# Patient Record
Sex: Male | Born: 1995 | Race: White | Hispanic: No | Marital: Married | Smoking: Current some day smoker
Health system: Southern US, Community
[De-identification: ages and names within clinical notes are randomized; demographics above are authoritative.]

## PROBLEM LIST (undated history)

## (undated) DIAGNOSIS — I10 Essential (primary) hypertension: Secondary | ICD-10-CM

## (undated) HISTORY — PX: TONSILLECTOMY: SUR1361

---
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
## 2015-03-20 DIAGNOSIS — S70319A Abrasion, unspecified thigh, initial encounter: Secondary | ICD-10-CM | POA: Insufficient documentation

## 2015-03-20 DIAGNOSIS — H902 Conductive hearing loss, unspecified: Secondary | ICD-10-CM | POA: Insufficient documentation

## 2015-09-16 ENCOUNTER — Encounter (HOSPITAL_COMMUNITY): Payer: Self-pay | Admitting: *Deleted

## 2015-09-16 ENCOUNTER — Emergency Department (HOSPITAL_COMMUNITY)
Admission: EM | Admit: 2015-09-16 | Discharge: 2015-09-16 | Disposition: A | Discharge status: Home / Self Care | Payer: BLUE CROSS/BLUE SHIELD | Attending: Emergency Medicine | Admitting: Emergency Medicine

## 2015-09-16 DIAGNOSIS — Z79899 Other long term (current) drug therapy: Secondary | ICD-10-CM | POA: Insufficient documentation

## 2015-09-16 DIAGNOSIS — R42 Dizziness and giddiness: Secondary | ICD-10-CM | POA: Diagnosis not present

## 2015-09-16 DIAGNOSIS — I10 Essential (primary) hypertension: Secondary | ICD-10-CM | POA: Diagnosis not present

## 2015-09-16 HISTORY — DX: Essential (primary) hypertension: I10

## 2015-09-16 MED ORDER — MECLIZINE HCL 50 MG PO TABS: 25.0000 mg | ORAL_TABLET | Freq: Three times a day (TID) | ORAL | Status: DC | PRN

## 2015-09-16 MED ORDER — MECLIZINE HCL 25 MG PO TABS
25.0000 mg | ORAL_TABLET | Freq: Once | ORAL | Status: AC
  Administered 2015-09-16: 25 mg via ORAL
  Filled 2015-09-16: qty 1

## 2015-09-16 NOTE — Discharge Instructions (Signed)
You have been seen today for high blood pressure. You will need to follow up with your PCP as soon as possible for possible medication adjustment. Return to ED should symptoms worsen.

## 2015-09-16 NOTE — ED Notes (Addendum)
Pt reports that he started feeling bad approx 1 hour ago. Denies headache but has dizziness and "feels drunk" hx of htn and takes lisinopril. Reports HTN pta. Also recently started on antibiotics due to toe infection.
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
## 2017-09-02 ENCOUNTER — Ambulatory Visit (INDEPENDENT_AMBULATORY_CARE_PROVIDER_SITE_OTHER): Payer: BLUE CROSS/BLUE SHIELD | Admitting: Podiatry

## 2017-09-02 ENCOUNTER — Ambulatory Visit: Payer: Self-pay

## 2017-09-02 ENCOUNTER — Encounter: Payer: Self-pay | Admitting: Podiatry

## 2017-09-02 DIAGNOSIS — L600 Ingrowing nail: Secondary | ICD-10-CM | POA: Diagnosis not present

## 2017-09-02 DIAGNOSIS — M79671 Pain in right foot: Secondary | ICD-10-CM

## 2017-09-02 MED ORDER — NEOMYCIN-POLYMYXIN-HC 3.5-10000-1 OT SOLN: OTIC | 0 refills | Status: DC

## 2017-09-02 NOTE — Patient Instructions (Signed)

## 2017-09-02 NOTE — Progress Notes (Signed)
  Subjective:  Patient ID: Randy SextonEli Zimmerman, male    DOB: 04/04/1996,  MRN: [id number] HPI Chief Complaint  Patient presents with  . Ingrown Toenail    Patient presents today for ingrown bilat borders of rt great toe x 2 months off and on.  He states toe is red, draining and very sore to touch.  He has been clipping nail out himself, but just grows back and using neosporin and peroxide to treat toe    21 y.o. male presents with the above complaint.     Past Medical History:  Diagnosis Date  . Hypertension    Past Surgical History:  Procedure Laterality Date  . TONSILLECTOMY     No current outpatient medications on file.  Allergies  Allergen Reactions  . Eggs Or Egg-Derived Products Nausea Only  . Prednisone     Extreme anger   Review of Systems  All other systems reviewed and are negative.  Objective:  There were no vitals filed for this visit.  General: Well developed, nourished, in no acute distress, alert and oriented x3   Dermatological: Skin is warm, dry and supple bilateral. Nails x 10 are well maintained; remaining integument appears unremarkable at this time. There are no open sores, no preulcerative lesions, no rash or signs of infection present. Sharp incurvated margins along the tibia and fibular border border hallux right. Gross granulation tissue and pain with mild erythema.  Vascular: Dorsalis Pedis artery and Posterior Tibial artery pedal pulses are 2/4 bilateral with immedate capillary fill time. Pedal hair growth present. No varicosities and no lower extremity edema present bilateral.   Neruologic: Grossly intact via light touch bilateral. Vibratory intact via tuning fork bilateral. Protective threshold with Semmes Wienstein monofilament intact to all pedal sites bilateral. Patellar and Achilles deep tendon reflexes 2+ bilateral. No Babinski or clonus noted bilateral.   Musculoskeletal: No gross boney pedal deformities bilateral. No pain, crepitus, or limitation  noted with foot and ankle range of motion bilateral. Muscular strength 5/5 in all groups tested bilateral.  Gait: Unassisted, Nonantalgic.    Radiographs:  None taken  Assessment & Plan:   Assessment: Ingrown toenail tibial border hallux right  Plan: Discussed etiology pathology inserted versus surgical therapies. At this point decided to perform a chemical sect made which was performed today after local anesthesia was diminished. He tolerated procedure well without complications. He was provided with prescription for Cortisporin Otic be applied twice daily after soaking. He was instructed that if he broke his skin out he would opt medication. He will also soak twice daily Betadine and water. He will then apply Band-Aid twice daily. He will follow up with me in 2 weeks     Jorah Hua T. RockHyatt, North Dakota[place]PM

## 2017-09-14 ENCOUNTER — Ambulatory Visit: Payer: BLUE CROSS/BLUE SHIELD | Admitting: Podiatry

## 2017-12-03 ENCOUNTER — Emergency Department: Payer: Worker's Compensation

## 2017-12-03 ENCOUNTER — Other Ambulatory Visit: Payer: Self-pay

## 2017-12-03 ENCOUNTER — Encounter: Payer: Self-pay | Admitting: Emergency Medicine

## 2017-12-03 ENCOUNTER — Emergency Department
Admission: EM | Admit: 2017-12-03 | Discharge: 2017-12-03 | Disposition: A | Discharge status: Home / Self Care | Payer: Worker's Compensation | Attending: Emergency Medicine | Admitting: Emergency Medicine

## 2017-12-03 DIAGNOSIS — S60419A Abrasion of unspecified finger, initial encounter: Secondary | ICD-10-CM

## 2017-12-03 DIAGNOSIS — Y990 Civilian activity done for income or pay: Secondary | ICD-10-CM | POA: Diagnosis not present

## 2017-12-03 DIAGNOSIS — Z23 Encounter for immunization: Secondary | ICD-10-CM | POA: Diagnosis not present

## 2017-12-03 DIAGNOSIS — S60511A Abrasion of right hand, initial encounter: Secondary | ICD-10-CM | POA: Diagnosis not present

## 2017-12-03 DIAGNOSIS — I10 Essential (primary) hypertension: Secondary | ICD-10-CM | POA: Insufficient documentation

## 2017-12-03 DIAGNOSIS — W25XXXA Contact with sharp glass, initial encounter: Secondary | ICD-10-CM | POA: Insufficient documentation

## 2017-12-03 DIAGNOSIS — Y929 Unspecified place or not applicable: Secondary | ICD-10-CM | POA: Diagnosis not present

## 2017-12-03 DIAGNOSIS — Y939 Activity, unspecified: Secondary | ICD-10-CM | POA: Insufficient documentation

## 2017-12-03 MED ORDER — TETANUS-DIPHTH-ACELL PERTUSSIS 5-2.5-18.5 LF-MCG/0.5 IM SUSP
0.5000 mL | Freq: Once | INTRAMUSCULAR | Status: AC
  Administered 2017-12-03: 0.5 mL via INTRAMUSCULAR
  Filled 2017-12-03: qty 0.5

## 2017-12-03 MED ORDER — SULFAMETHOXAZOLE-TRIMETHOPRIM 800-160 MG PO TABS: 1.0000 | ORAL_TABLET | Freq: Two times a day (BID) | ORAL | 0 refills | Status: DC

## 2017-12-03 MED ORDER — NEOMYCIN-POLYMYXIN-PRAMOXINE 1 % EX CREA: TOPICAL_CREAM | Freq: Two times a day (BID) | CUTANEOUS | 0 refills | Status: DC

## 2017-12-03 NOTE — ED Notes (Signed)
Abrasions and lacerations cleaned with sterile water and betadine at this time.

## 2017-12-03 NOTE — Discharge Instructions (Signed)
Keep wound clean and dry.

## 2017-12-03 NOTE — ED Notes (Signed)
Per [employer] chief Nim Harris at [phone] pt is workers comp with no urine drug screen or labs needed
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
## 2018-04-13 ENCOUNTER — Ambulatory Visit
Admission: RE | Admit: 2018-04-13 | Discharge: 2018-04-13 | Disposition: A | Discharge status: Home / Self Care | Payer: No Typology Code available for payment source | Source: Ambulatory Visit | Attending: Nurse Practitioner | Admitting: Nurse Practitioner

## 2018-04-13 ENCOUNTER — Other Ambulatory Visit: Payer: Self-pay | Admitting: Nurse Practitioner

## 2018-04-13 DIAGNOSIS — Z021 Encounter for pre-employment examination: Secondary | ICD-10-CM
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
## 2019-05-13 ENCOUNTER — Ambulatory Visit: Payer: Self-pay | Admitting: Internal Medicine

## 2019-06-11 IMAGING — CR DG CHEST 1V
1 series · 1 of 1 positions shown · non-contrast
Comparison: None.

CLINICAL DATA: Pre-employment chest x-ray.  No symptoms.

EXAM:
CHEST  1 VIEW

[w chest pa]
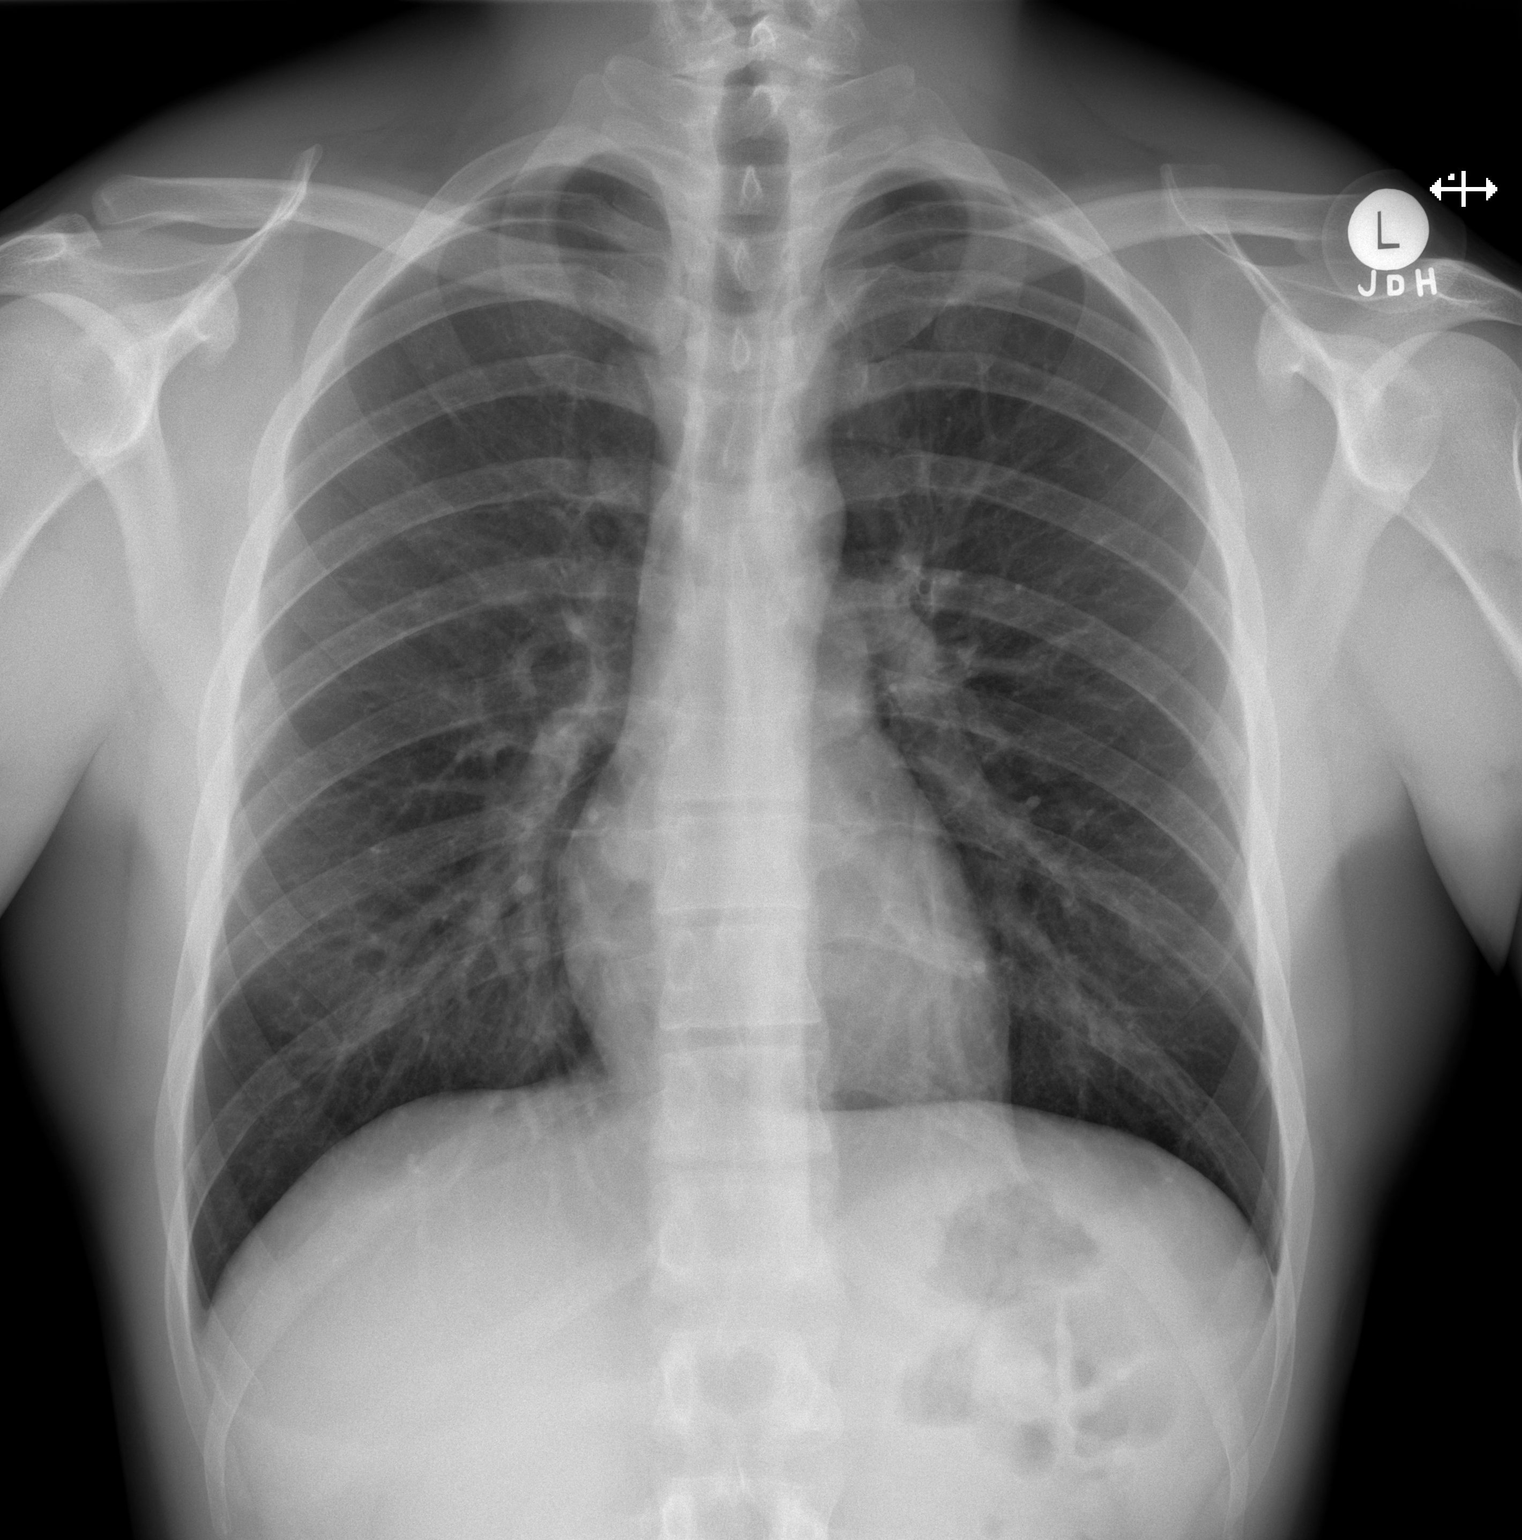

[1 of 1 positions shown; findings below may reference images not displayed]

FINDINGS: The heart size and mediastinal contours are within normal limits.
Both lungs are clear. The visualized skeletal structures are
unremarkable.
IMPRESSION: No active disease.

## 2019-09-12 ENCOUNTER — Other Ambulatory Visit: Payer: Self-pay

## 2019-09-12 DIAGNOSIS — Z20822 Contact with and (suspected) exposure to covid-19: Secondary | ICD-10-CM

## 2019-09-14 LAB — NOVEL CORONAVIRUS, NAA: SARS-CoV-2, NAA: NOT DETECTED

## 2019-09-15 ENCOUNTER — Telehealth: Payer: Self-pay

## 2019-09-15 NOTE — Telephone Encounter (Signed)
Caller given negative result and verbalized understanding  

## 2019-10-04 ENCOUNTER — Ambulatory Visit: Payer: 59 | Attending: Internal Medicine

## 2019-10-04 DIAGNOSIS — Z20822 Contact with and (suspected) exposure to covid-19: Secondary | ICD-10-CM

## 2019-10-05 LAB — NOVEL CORONAVIRUS, NAA: SARS-CoV-2, NAA: NOT DETECTED

## 2020-03-01 DIAGNOSIS — S9032XA Contusion of left foot, initial encounter: Secondary | ICD-10-CM | POA: Insufficient documentation

## 2020-03-03 DIAGNOSIS — Y999 Unspecified external cause status: Secondary | ICD-10-CM | POA: Insufficient documentation

## 2020-03-03 DIAGNOSIS — W208XXA Other cause of strike by thrown, projected or falling object, initial encounter: Secondary | ICD-10-CM | POA: Insufficient documentation

## 2021-09-17 ENCOUNTER — Ambulatory Visit: Payer: Self-pay

## 2021-09-17 ENCOUNTER — Ambulatory Visit: Payer: Self-pay | Admitting: Physician Assistant

## 2021-09-17 ENCOUNTER — Encounter: Payer: Self-pay | Admitting: Physician Assistant

## 2021-09-17 ENCOUNTER — Other Ambulatory Visit: Payer: Self-pay

## 2021-09-17 VITALS — BP 147/90 | HR 73 | Temp 98.9°F | Resp 14 | Ht 71.0 in | Wt 200.0 lb

## 2021-09-17 DIAGNOSIS — Z021 Encounter for pre-employment examination: Secondary | ICD-10-CM

## 2021-09-17 LAB — POCT URINALYSIS DIPSTICK
Bilirubin, UA: NEGATIVE
Blood, UA: NEGATIVE
Glucose, UA: NEGATIVE
Ketones, UA: NEGATIVE
Leukocytes, UA: NEGATIVE
Nitrite, UA: NEGATIVE
Protein, UA: NEGATIVE
Spec Grav, UA: 1.015 (ref 1.010–1.025)
Urobilinogen, UA: 0.2 E.U./dL
pH, UA: 6 (ref 5.0–8.0)

## 2021-09-17 NOTE — Progress Notes (Signed)
Pt denies any issues or concerns at this time./CL,RMA  Reviewed CDC recommendations for importance of HIV/ Hep C screening once in lifetime. Patient has declined HIV / Hep C screenings today and will let us know if they should change their mind in the future.   Reviewed CDC recommendations for importance of the COVID-19 Vaccine. Pt has declined the vaccine today.

## 2021-09-17 NOTE — Progress Notes (Signed)
Pt denies any issues or concerns at this time/CL,RMA ?

## 2021-09-17 NOTE — Progress Notes (Signed)
° °  Subjective: Preemployment exam    Patient ID: Randy Zimmerman, male    DOB: [birth year], 25 y.o.   MRN: [id number]  HPI Patient presents for preemployment physical exam for [employer].  Patient reports no concerns or complaints.   Review of Systems Hypertension and insomnia.    Objective:   Physical Exam  No acute distress.  Temperature 98.9, pulse 73, respiration 14, BP is 147/90, patient 90% O2 sat on room air.  Patient weighs 200 pounds and BMI is 27.89. HEENT is unremarkable.  Neck is supple without lymphadenopathy or bruits.  Lungs are clear to auscultation.  Heart regular rate and rhythm. No obvious deformity to the upper or lower extremities.  Patient has full and equal range of motion of the upper and lower extremities. No obvious deformity to cervical lumbar spine.  Patient has full and equal range of motion cervical lumbar spine. Cranial nerves II through XII are grossly intact.  DTR 2+ without clonus.      Assessment & Plan: Well exam.   Patient blood pressure is elevated.  Patient relates that he has been out of blood pressure medication for 2 weeks.  Advised patient to purchase and take medication and follow-up in 5 days for recheck of blood pressure.

## 2021-09-20 ENCOUNTER — Ambulatory Visit: Payer: Self-pay

## 2021-09-20 ENCOUNTER — Other Ambulatory Visit: Payer: Self-pay

## 2021-09-20 VITALS — BP 138/77 | HR 69 | Resp 14

## 2021-09-20 DIAGNOSIS — Z0130 Encounter for examination of blood pressure without abnormal findings: Secondary | ICD-10-CM

## 2021-09-20 NOTE — Progress Notes (Signed)
Pt presents today for BP check. Pt will return Monday for another check per Ron Smith,PA-C. After two days of BP medication pt wanted to stop by to check for himself.

## 2021-09-21 LAB — CMP12+LP+TP+TSH+6AC+CBC/D/PLT
ALT: 23 IU/L (ref 0–44)
AST: 23 IU/L (ref 0–40)
Albumin/Globulin Ratio: 2 (ref 1.2–2.2)
Albumin: 5 g/dL (ref 4.1–5.2)
Alkaline Phosphatase: 61 IU/L (ref 44–121)
BUN/Creatinine Ratio: 10 (ref 9–20)
BUN: 12 mg/dL (ref 6–20)
Basophils Absolute: 0.1 10*3/uL (ref 0.0–0.2)
Basos: 1 %
Bilirubin Total: 0.7 mg/dL (ref 0.0–1.2)
Calcium: 9.9 mg/dL (ref 8.7–10.2)
Chloride: 101 mmol/L (ref 96–106)
Chol/HDL Ratio: 3.6 ratio (ref 0.0–5.0)
Cholesterol, Total: 210 mg/dL — ABNORMAL HIGH (ref 100–199)
Creatinine, Ser: 1.21 mg/dL (ref 0.76–1.27)
EOS (ABSOLUTE): 0 10*3/uL (ref 0.0–0.4)
Eos: 0 %
Estimated CHD Risk: 0.6 times avg. (ref 0.0–1.0)
Free Thyroxine Index: 3.1 (ref 1.2–4.9)
GGT: 13 IU/L (ref 0–65)
Globulin, Total: 2.5 g/dL (ref 1.5–4.5)
Glucose: 95 mg/dL (ref 70–99)
HDL: 58 mg/dL (ref >39)
Hematocrit: 45.7 % (ref 37.5–51.0)
Hemoglobin: 15.8 g/dL (ref 13.0–17.7)
Immature Grans (Abs): 0 10*3/uL (ref 0.0–0.1)
Immature Granulocytes: 0 %
Iron: 143 ug/dL (ref 38–169)
LDH: 219 IU/L (ref 121–224)
LDL Chol Calc (NIH): 140 mg/dL — ABNORMAL HIGH (ref 0–99)
Lymphocytes Absolute: 1.7 10*3/uL (ref 0.7–3.1)
Lymphs: 24 %
MCH: 29.5 pg (ref 26.6–33.0)
MCHC: 34.6 g/dL (ref 31.5–35.7)
MCV: 85 fL (ref 79–97)
Monocytes Absolute: 0.4 10*3/uL (ref 0.1–0.9)
Monocytes: 6 %
Neutrophils Absolute: 5.1 10*3/uL (ref 1.4–7.0)
Neutrophils: 69 %
Phosphorus: 3.4 mg/dL (ref 2.8–4.1)
Platelets: 282 10*3/uL (ref 150–450)
Potassium: 4.3 mmol/L (ref 3.5–5.2)
RBC: 5.36 x10E6/uL (ref 4.14–5.80)
RDW: 12.1 % (ref 11.6–15.4)
Sodium: 139 mmol/L (ref 134–144)
T3 Uptake Ratio: 30 % (ref 24–39)
T4, Total: 10.3 ug/dL (ref 4.5–12.0)
TSH: 1.01 u[IU]/mL (ref 0.450–4.500)
Total Protein: 7.5 g/dL (ref 6.0–8.5)
Triglycerides: 68 mg/dL (ref 0–149)
Uric Acid: 6 mg/dL (ref 3.8–8.4)
VLDL Cholesterol Cal: 12 mg/dL (ref 5–40)
WBC: 7.3 10*3/uL (ref 3.4–10.8)
eGFR: 84 mL/min/{1.73_m2} (ref >59)

## 2021-09-21 LAB — QUANTIFERON-TB GOLD PLUS
QuantiFERON Mitogen Value: 8.22 IU/mL
QuantiFERON Nil Value: 0 IU/mL
QuantiFERON TB1 Ag Value: 0 IU/mL
QuantiFERON TB2 Ag Value: 0 IU/mL
QuantiFERON-TB Gold Plus: NEGATIVE

## 2021-09-21 LAB — HEPATITIS B SURFACE ANTIBODY,QUALITATIVE: Hep B Surface Ab, Qual: REACTIVE

## 2021-09-23 ENCOUNTER — Ambulatory Visit: Payer: Self-pay

## 2021-09-23 ENCOUNTER — Other Ambulatory Visit: Payer: Self-pay

## 2021-09-23 VITALS — BP 143/81

## 2021-09-23 DIAGNOSIS — Z0130 Encounter for examination of blood pressure without abnormal findings: Secondary | ICD-10-CM

## 2021-09-23 NOTE — Progress Notes (Signed)
Returned to [employer] for follow-up BP check as requested by Durward Parcel, PA-C.  States he's been taking his BP medication past 5 days.  His wife has been checking BP at home & systolic has been in 130's & diastolic has been 80 or below.  Reviewed above info with Durward Parcel, PA-C.  AMD

## 2022-01-31 DIAGNOSIS — M542 Cervicalgia: Secondary | ICD-10-CM | POA: Diagnosis not present

## 2022-02-01 DIAGNOSIS — X500XXA Overexertion from strenuous movement or load, initial encounter: Secondary | ICD-10-CM | POA: Diagnosis not present

## 2022-02-01 DIAGNOSIS — S161XXA Strain of muscle, fascia and tendon at neck level, initial encounter: Secondary | ICD-10-CM | POA: Diagnosis not present

## 2022-02-01 DIAGNOSIS — S29012A Strain of muscle and tendon of back wall of thorax, initial encounter: Secondary | ICD-10-CM | POA: Diagnosis not present

## 2022-02-25 ENCOUNTER — Other Ambulatory Visit: Payer: Self-pay

## 2022-02-25 NOTE — Progress Notes (Signed)
Pt cleared pre-employment UDS. 

## 2022-06-03 DIAGNOSIS — R790 Abnormal level of blood mineral: Secondary | ICD-10-CM | POA: Diagnosis not present

## 2022-06-03 DIAGNOSIS — R5383 Other fatigue: Secondary | ICD-10-CM | POA: Diagnosis not present

## 2022-06-03 DIAGNOSIS — Z131 Encounter for screening for diabetes mellitus: Secondary | ICD-10-CM | POA: Diagnosis not present

## 2022-06-03 DIAGNOSIS — E291 Testicular hypofunction: Secondary | ICD-10-CM | POA: Diagnosis not present

## 2022-06-03 DIAGNOSIS — E611 Iron deficiency: Secondary | ICD-10-CM | POA: Diagnosis not present

## 2022-06-03 DIAGNOSIS — E559 Vitamin D deficiency, unspecified: Secondary | ICD-10-CM | POA: Diagnosis not present

## 2022-06-03 DIAGNOSIS — R69 Illness, unspecified: Secondary | ICD-10-CM | POA: Diagnosis not present

## 2022-06-03 DIAGNOSIS — G43109 Migraine with aura, not intractable, without status migrainosus: Secondary | ICD-10-CM | POA: Diagnosis not present

## 2022-06-03 DIAGNOSIS — M549 Dorsalgia, unspecified: Secondary | ICD-10-CM | POA: Diagnosis not present

## 2022-06-03 DIAGNOSIS — I10 Essential (primary) hypertension: Secondary | ICD-10-CM | POA: Diagnosis not present

## 2022-06-03 DIAGNOSIS — E538 Deficiency of other specified B group vitamins: Secondary | ICD-10-CM | POA: Diagnosis not present

## 2022-06-03 DIAGNOSIS — Z1329 Encounter for screening for other suspected endocrine disorder: Secondary | ICD-10-CM | POA: Diagnosis not present

## 2022-06-26 DIAGNOSIS — J019 Acute sinusitis, unspecified: Secondary | ICD-10-CM | POA: Diagnosis not present

## 2022-07-03 DIAGNOSIS — I10 Essential (primary) hypertension: Secondary | ICD-10-CM | POA: Diagnosis not present

## 2022-07-03 DIAGNOSIS — E291 Testicular hypofunction: Secondary | ICD-10-CM | POA: Diagnosis not present

## 2022-09-09 DIAGNOSIS — E291 Testicular hypofunction: Secondary | ICD-10-CM | POA: Diagnosis not present

## 2022-10-11 DIAGNOSIS — J019 Acute sinusitis, unspecified: Secondary | ICD-10-CM | POA: Diagnosis not present

## 2022-10-11 DIAGNOSIS — J069 Acute upper respiratory infection, unspecified: Secondary | ICD-10-CM | POA: Diagnosis not present

## 2022-10-26 DIAGNOSIS — H68109 Unspecified obstruction of Eustachian tube, unspecified ear: Secondary | ICD-10-CM | POA: Diagnosis not present

## 2022-12-02 DIAGNOSIS — Z79899 Other long term (current) drug therapy: Secondary | ICD-10-CM | POA: Diagnosis not present

## 2022-12-02 DIAGNOSIS — E291 Testicular hypofunction: Secondary | ICD-10-CM | POA: Diagnosis not present

## 2022-12-10 ENCOUNTER — Ambulatory Visit: Payer: Self-pay

## 2022-12-10 DIAGNOSIS — Z0000 Encounter for general adult medical examination without abnormal findings: Secondary | ICD-10-CM

## 2022-12-10 LAB — POCT URINALYSIS DIPSTICK
Bilirubin, UA: NEGATIVE
Blood, UA: NEGATIVE
Glucose, UA: NEGATIVE
Ketones, UA: NEGATIVE
Leukocytes, UA: NEGATIVE
Nitrite, UA: NEGATIVE
Protein, UA: NEGATIVE
Spec Grav, UA: 1.02 (ref 1.010–1.025)
Urobilinogen, UA: 0.2 E.U./dL
pH, UA: 6 (ref 5.0–8.0)

## 2022-12-10 NOTE — Progress Notes (Signed)
Pt presents today to complete lab portion of physical. [name]

## 2022-12-11 LAB — CMP12+LP+TP+TSH+6AC+CBC/D/PLT
ALT: 22 IU/L (ref 0–44)
AST: 30 IU/L (ref 0–40)
Albumin/Globulin Ratio: 2 (ref 1.2–2.2)
Albumin: 4.7 g/dL (ref 4.3–5.2)
Alkaline Phosphatase: 66 IU/L (ref 44–121)
BUN/Creatinine Ratio: 14 (ref 9–20)
BUN: 17 mg/dL (ref 6–20)
Basophils Absolute: 0 10*3/uL (ref 0.0–0.2)
Basos: 1 %
Bilirubin Total: 0.7 mg/dL (ref 0.0–1.2)
Calcium: 9.5 mg/dL (ref 8.7–10.2)
Chloride: 103 mmol/L (ref 96–106)
Chol/HDL Ratio: 4.6 ratio (ref 0.0–5.0)
Cholesterol, Total: 178 mg/dL (ref 100–199)
Creatinine, Ser: 1.21 mg/dL (ref 0.76–1.27)
EOS (ABSOLUTE): 0.2 10*3/uL (ref 0.0–0.4)
Eos: 3 %
Estimated CHD Risk: 0.9 times avg. (ref 0.0–1.0)
Free Thyroxine Index: 2.3 (ref 1.2–4.9)
GGT: 13 IU/L (ref 0–65)
Globulin, Total: 2.3 g/dL (ref 1.5–4.5)
Glucose: 94 mg/dL (ref 70–99)
HDL: 39 mg/dL — ABNORMAL LOW (ref >39)
Hematocrit: 46.7 % (ref 37.5–51.0)
Hemoglobin: 16.2 g/dL (ref 13.0–17.7)
Immature Grans (Abs): 0 10*3/uL (ref 0.0–0.1)
Immature Granulocytes: 0 %
Iron: 154 ug/dL (ref 38–169)
LDH: 202 IU/L (ref 121–224)
LDL Chol Calc (NIH): 122 mg/dL — ABNORMAL HIGH (ref 0–99)
Lymphocytes Absolute: 1.8 10*3/uL (ref 0.7–3.1)
Lymphs: 28 %
MCH: 30.2 pg (ref 26.6–33.0)
MCHC: 34.7 g/dL (ref 31.5–35.7)
MCV: 87 fL (ref 79–97)
Monocytes Absolute: 0.7 10*3/uL (ref 0.1–0.9)
Monocytes: 11 %
Neutrophils Absolute: 3.6 10*3/uL (ref 1.4–7.0)
Neutrophils: 57 %
Phosphorus: 3.3 mg/dL (ref 2.8–4.1)
Platelets: 312 10*3/uL (ref 150–450)
Potassium: 4 mmol/L (ref 3.5–5.2)
RBC: 5.37 x10E6/uL (ref 4.14–5.80)
RDW: 13.1 % (ref 11.6–15.4)
Sodium: 141 mmol/L (ref 134–144)
T3 Uptake Ratio: 28 % (ref 24–39)
T4, Total: 8.2 ug/dL (ref 4.5–12.0)
TSH: 1.01 u[IU]/mL (ref 0.450–4.500)
Total Protein: 7 g/dL (ref 6.0–8.5)
Triglycerides: 89 mg/dL (ref 0–149)
Uric Acid: 7 mg/dL (ref 3.8–8.4)
VLDL Cholesterol Cal: 17 mg/dL (ref 5–40)
WBC: 6.3 10*3/uL (ref 3.4–10.8)
eGFR: 84 mL/min/{1.73_m2} (ref >59)

## 2022-12-17 ENCOUNTER — Encounter: Payer: Self-pay | Admitting: Physician Assistant

## 2022-12-17 ENCOUNTER — Ambulatory Visit: Payer: Self-pay | Admitting: Physician Assistant

## 2022-12-17 VITALS — BP 148/92 | HR 75 | Temp 98.2°F | Resp 14 | Wt 205.0 lb

## 2022-12-17 DIAGNOSIS — I10 Essential (primary) hypertension: Secondary | ICD-10-CM

## 2022-12-17 DIAGNOSIS — Z0000 Encounter for general adult medical examination without abnormal findings: Secondary | ICD-10-CM

## 2022-12-17 MED ORDER — AMLODIPINE BESYLATE 10 MG PO TABS: 10.0000 mg | ORAL_TABLET | Freq: Every day | ORAL | 0 refills | Status: DC

## 2022-12-17 NOTE — Progress Notes (Signed)
Here for yearly physical with [employer].  No c/o voiced and sees provider with testosterone injections with.  Stated tried Norvasc and Lisinopril in the past without effectiveness or had side effects.

## 2022-12-17 NOTE — Progress Notes (Signed)
[facility]  ____________________________________________   None    (approximate)  I have reviewed the triage vital signs and the nursing notes.   HISTORY  Chief Complaint Annual Exam   HPI Randy Zimmerman is a 27 y.o. male patient presents for annual physical exam.  Voices concern for elevated blood pressure.  Patient has a history of elevated blood pressure in his late teenage years.  Patient state he was placed on a trial of a beta-blocker and an ACE inhibitor which he could not tolerate.  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 Authorizing Provider  testosterone cypionate (DEPOTESTOTERONE CYPIONATE) 100 MG/ML injection Inject 100 mg into the muscle every 14 (fourteen) days. For IM use only   Yes [provider]  traZODone (DESYREL) 50 MG tablet Take 1 tablet by mouth as needed. Patient not taking: Reported on 12/17/2022 03/18/21   [provider]    Allergies Hydrocodone bit-homatrop mbr, Prednisone, and Egg-derived products  Family History  Problem Relation Age of Onset   Hypertension Father    Cancer Paternal Uncle    Breast cancer Maternal Grandmother    Cancer Maternal Grandfather    Cancer Paternal Grandfather     Social History Social History   Tobacco Use   Smoking status: Some Days    Types: Cigars   Smokeless tobacco: Never   Substance Use Topics   Alcohol use: Yes   Drug use: No    Review of Systems Constitutional: No fever/chills Eyes: No visual changes. ENT: No sore throat. Cardiovascular: Denies chest pain. Respiratory: Denies shortness of breath. Gastrointestinal: No abdominal pain.  No nausea, no vomiting.  No diarrhea.  No constipation. Genitourinary: Negative for dysuria. Musculoskeletal: Negative for back pain. Skin: Negative for rash. Neurological: Negative for headaches, focal weakness or numbness. Endocrine: Hypertension ____________________________________________   PHYSICAL EXAM:  VITAL SIGNS: BP is 148/92, pulse 75, respiration 14, temperature 98.2, patient is 90% O2 sat on room air.  Patient weighs 205 pounds BMI is 28.59. Constitutional: Alert and oriented. Well appearing and in no acute distress. Eyes: Conjunctivae are normal. PERRL. EOMI. Head: Atraumatic. Nose: No congestion/rhinnorhea. Mouth/Throat: Mucous membranes are moist.  Oropharynx non-erythematous. Neck: No stridor.  No cervical spine tenderness to palpation. Hematological/Lymphatic/Immunilogical: No cervical lymphadenopathy. Cardiovascular: Normal rate, regular rhythm. Grossly normal heart sounds.  Good peripheral circulation. Respiratory: Normal respiratory effort.  No retractions. Lungs CTAB. Gastrointestinal: Soft and nontender. No distention. No abdominal bruits. No CVA tenderness. Genitourinary: Deferred Musculoskeletal: No lower extremity tenderness nor edema.  No joint effusions. Neurologic:  Normal speech and language. No gross focal neurologic deficits are appreciated. No gait instability. Skin:  Skin is warm, dry and intact. No rash noted. Psychiatric: Mood and affect are normal. Speech and behavior are normal.  ____________________________________________   LABS        Component Ref Range &  Units 7 d ago 1 yr ago 10 yr ago  Color, UA  Amber yellow   Clarity, UA  Clear clear   Glucose, UA Negative  Negative Negative   Bilirubin, UA  Negative negative   Ketones, UA  Negative negative   Spec Grav, UA 1.010 - 1.025 1.020 1.015   Blood, UA  Negative negative   pH, UA 5.0 - 8.0 6.0 6.0   Protein, UA Negative Negative Negative   Urobilinogen, UA 0.2 or 1.0 E.U./dL 0.2 0.2 1.0 R  Nitrite, UA  Negative negative   Leukocytes, UA Negative Negative Negative NEGATIVE R, CM  Appearance   light CLEAR R  Odor      Resulting Agency    [facility]         Specimen Collected: 12/10/22 10:04 Last Resulted: 12/10/22 10:04      Lab Flowsheet      Order Details      View Encounter      Lab and Collection Details      Routing      Result History    View All Conversations on this Encounter      CM=Additional comments  R=Reference range differs from displayed range      Result Care Coordination   Patient Communication   Add Comments   Add Notifications  Back to Top      Other Results from 12/10/2022   Contains abnormal data Executive Panel Order: [id number] Status: Final result      Visible to patient: Yes (not seen)      Next appt: None      Dx: Routine adult health maintenance    0 Result Notes            Component Ref Range & Units 7 d ago (12/10/22) 1 yr ago (09/17/21) 10 yr ago (12/23/11) 10 yr ago (12/23/11) 10 yr ago (12/23/11)  Glucose 70 - 99 mg/dL 94 95  90   Uric Acid 3.8 - 8.4 mg/dL 7.0 6.0 CM     Comment:            Therapeutic target for gout patients: <6.0  BUN 6 - 20 mg/dL '17 12  13 '$ R   Creatinine, Ser 0.76 - 1.27 mg/dL 1.21 1.21  0.93 R   eGFR >59 mL/min/1.73 84 84     BUN/Creatinine Ratio 9 - '20 14 10     '$ Sodium 134 - 144 mmol/L 141 139  137 R   Potassium 3.5 - 5.2 mmol/L 4.0 4.3  4.2 R   Chloride 96 - 106 mmol/L 103 101  101 R   Calcium 8.7 - 10.2 mg/dL 9.5 9.9  9.6 R   Phosphorus 2.8 - 4.1 mg/dL 3.3 3.4     Total Protein 6.0 - 8.5 g/dL 7.0 7.5  7.6 R   Albumin 4.3 - 5.2 g/dL 4.7 5.0 R  4.5 R   Globulin, Total 1.5 - 4.5 g/dL 2.3 2.5      Albumin/Globulin Ratio 1.2 - 2.2 2.0 2.0     Bilirubin Total 0.0 - 1.2 mg/dL 0.7 0.7  0.7 R   Alkaline Phosphatase 44 - 121 IU/L 66 61  97 R   LDH 121 - 224 IU/L 202 219     AST 0 - 40 IU/L '30 23  28 '$ R   ALT 0 - 44 IU/L '22 23  18 '$ R   GGT 0 - 65 IU/L 13 13     Iron 38 - 169  ug/dL 154 143     Cholesterol, Total 100 - 199 mg/dL 178 210 High      Triglycerides 0 - 149 mg/dL 89 68     HDL >39 mg/dL 39 Low  58     VLDL Cholesterol Cal 5 - 40 mg/dL 17 12     LDL Chol Calc (NIH) 0 - 99 mg/dL 122 High  140 High      Chol/HDL Ratio 0.0 - 5.0 ratio 4.6 3.6 CM     Comment:                                   T. Chol/HDL Ratio                                             Men  Women                               1/2 Avg.Risk  3.4    3.3                                   Avg.Risk  5.0    4.4                                2X Avg.Risk  9.6    7.1                                3X Avg.Risk 23.4   11.0  Estimated CHD Risk 0.0 - 1.0 times avg. 0.9 0.6 CM     Comment: The CHD Risk is based on the T. Chol/HDL ratio. Other factors affect CHD Risk such as hypertension, smoking, diabetes, severe obesity, and family history of premature CHD.  TSH 0.450 - 4.500 uIU/mL 1.010 1.010     T4, Total 4.5 - 12.0 ug/dL 8.2 10.3     T3 Uptake Ratio 24 - 39 % 28 30     Free Thyroxine Index 1.2 - 4.9 2.3 3.1     WBC 3.4 - 10.8 x10E3/uL 6.3 7.3   4.2 Low  R  RBC 4.14 - 5.80 x10E6/uL 5.37 5.36   5.15 R  Hemoglobin 13.0 - 17.7 g/dL 16.2 15.8   15.6 R  Hematocrit 37.5 - 51.0 % 46.7 45.7   43.7 R  MCV 79 - 97 fL 87 85   84.9 R  MCH 26.6 - 33.0 pg 30.2 29.5   30.3 R  MCHC 31.5 - 35.7 g/dL 34.7 34.6   35.7 R  RDW 11.6 - 15.4 % 13.1 12.1   12.4 R  Platelets 150 - 450 x10E3/uL 312 282   179 R  Neutrophils Not Estab. % 57 69 46 R    Lymphs Not Estab. % 28 24     Monocytes Not Estab. % 11 6     Eos Not Estab. % 3 0     Basos Not Estab. % 1 1     Neutrophils Absolute 1.4 - 7.0 x10E3/uL 3.6 5.1 1.9 R    Lymphocytes Absolute  0.7 -  3.1 x10E3/uL 1.8 1.7 1.4 R    Monocytes Absolute 0.1 - 0.9 x10E3/uL 0.7 0.4     EOS (ABSOLUTE) 0.0 - 0.4 x10E3/uL 0.2 0.0     Basophils Absolute 0.0 - 0.2 x10E3/uL 0.0 0.1 0.0 R    Immature Granulocytes Not Estab. % 0 0                 ____________________________________________   ____________________________________________   INITIAL IMPRESSION / ASSESSMENT AND PLAN  As part of my medical decision making, I reviewed the following data within the electronic MEDICAL RECORD NUMBER       No acute findings on physical exam except for elevated blood pressure.  Patient given a prescription for Norvasc 10 mg and follow-up in 3 months.     ____________________________________________   FINAL CLINICAL IMPRESSION  Well exam   ED Discharge Orders     None        Note:  This document was prepared using Dragon voice recognition software and may include unintentional dictation errors.

## 2022-12-17 NOTE — Progress Notes (Deleted)
[facility] ____________________________________________   None    (approximate)  I have reviewed the triage vital signs and the nursing notes.   HISTORY  Chief Complaint No chief complaint on file.   HPI [name] is a 27 y.o. 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 Authorizing Provider  testosterone cypionate (DEPOTESTOTERONE CYPIONATE) 100 MG/ML injection Inject 100 mg into the muscle every 14 (fourteen) days. For IM use only   Yes [provider]  traZODone (DESYREL) 50 MG tablet Take 1 tablet by mouth as needed. Patient not taking: Reported on 12/17/2022 03/18/21   [provider]    Allergies Hydrocodone bit-homatrop mbr, Prednisone, and Egg-derived products  Family History  Problem Relation Age of Onset   Hypertension Father    Cancer Paternal Uncle    Breast cancer Maternal Grandmother    Cancer Maternal Grandfather    Cancer Paternal Grandfather     Social History Social History   Tobacco Use   Smoking status: Some Days    Types: Cigars   Smokeless tobacco: Never  Substance Use Topics   Alcohol use: Yes   Drug use: No    Review of Systems  Constitutional: No fever/chills Eyes: No visual changes. ENT: No sore throat. Cardiovascular: Denies chest pain. Respiratory: Denies shortness of breath. Gastrointestinal: No abdominal pain.  No  nausea, no vomiting.  No diarrhea.  No constipation. Genitourinary: Negative for dysuria. Musculoskeletal: Negative for back pain. Skin: Negative for rash. Neurological: Negative for headaches, focal weakness or numbness. Endocrine: Hypertension Allergic/Immunilogical: Hydrocodone, prednisone, headache products. ____________________________________________   PHYSICAL EXAM:  VITAL SIGNS: Constitutional: Alert and oriented. Well appearing and in no acute distress. Eyes: Conjunctivae are normal. PERRL. EOMI. Head: Atraumatic. Nose: No congestion/rhinnorhea. Mouth/Throat: Mucous membranes are moist.  Oropharynx non-erythematous. Neck: No stridor.  {**No cervical spine tenderness to palpation.**} {**Hematological/Lymphatic/Immunilogical: No cervical lymphadenopathy. **}Cardiovascular: Normal rate, regular rhythm. Grossly normal heart sounds.  Good peripheral circulation. Respiratory: Normal respiratory effort.  No retractions. Lungs CTAB. Gastrointestinal: Soft and nontender. No distention. No abdominal bruits. No CVA tenderness. {**Genitourinary:  **}Musculoskeletal: No lower extremity tenderness nor edema.  No joint effusions. Neurologic:  Normal speech and language. No gross focal neurologic deficits are appreciated. No gait instability. Skin:  Skin is warm, dry and intact. No rash noted. Psychiatric: Mood and affect are normal. Speech and behavior are normal.  ____________________________________________   LABS  ____________________________________________    ____________________________________________   INITIAL IMPRESSION / ASSESSMENT AND PLAN  As part of my medical decision making, I reviewed the following data within the [facility]              ____________________________________________   FINAL CLINICAL IMPRESSION    ED Discharge Orders     None        Note:  This document was prepared using Dragon voice  recognition software and  may include unintentional dictation errors.

## 2022-12-24 ENCOUNTER — Other Ambulatory Visit: Payer: Self-pay

## 2022-12-24 ENCOUNTER — Encounter: Payer: Self-pay | Admitting: Emergency Medicine

## 2022-12-24 ENCOUNTER — Ambulatory Visit
Admission: EM | Admit: 2022-12-24 | Discharge: 2022-12-24 | Disposition: A | Discharge status: Home / Self Care | Payer: 59 | Attending: Nurse Practitioner | Admitting: Nurse Practitioner

## 2022-12-24 DIAGNOSIS — S139XXA Sprain of joints and ligaments of unspecified parts of neck, initial encounter: Secondary | ICD-10-CM | POA: Diagnosis not present

## 2022-12-24 DIAGNOSIS — M542 Cervicalgia: Secondary | ICD-10-CM | POA: Diagnosis not present

## 2022-12-24 MED ORDER — IBUPROFEN 800 MG PO TABS: 800.0000 mg | ORAL_TABLET | Freq: Three times a day (TID) | ORAL | 0 refills | Status: DC | PRN

## 2022-12-24 MED ORDER — METHOCARBAMOL 500 MG PO TABS: 500.0000 mg | ORAL_TABLET | Freq: Two times a day (BID) | ORAL | 0 refills | Status: DC

## 2022-12-24 NOTE — ED Triage Notes (Signed)
Pt reports neck pain since this afternoon after picking up a towel. Pt reports bent over and states dog was on other end and reports when pulled towel heard pop on right side of neck. Pt reports believe flared up old neck injury. Limited ROM noted in neck.

## 2022-12-24 NOTE — ED Provider Notes (Signed)
[facility]    CSN: [id number] Arrival date & time: 12/24/22  1824      History   Chief Complaint Chief Complaint  Patient presents with   Neck Pain    HPI Randy Zimmerman is a 27 y.o. male.   The history is provided by the patient.   The patient presents for complaints of neck pain that started today.  Patient states he was getting out of the shower to dry his hair and the towels on the floor.  He states as a telling him the floor the dog stepped on the tablet and he went to raise his neck, he felt a pop on the right side of his neck.  Since that time, he has had difficulty moving his neck from side-to-side, and looking up and down.  Patient states that he had a previous neck injury from lifting weights, and thinks this caused it to flareup again.  Patient denies numbness, tingling, or radiation of pain.  He further denies headache, dizziness, or lightheadedness.  He states that he took ibuprofen 800 mg for his symptoms.  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 Authorizing Provider  ibuprofen (ADVIL) 800 MG tablet Take 1 tablet (800 mg total) by mouth every 8 (eight) hours as needed. 12/24/22  Yes Rennee Coyne-Warren, Alda Lea, NP  methocarbamol (ROBAXIN) 500 MG tablet Take 1 tablet (500 mg total) by mouth 2 (two) times daily. 12/24/22  Yes Elnoria Livingston-Warren, Alda Lea, NP  amLODipine (NORVASC) 10 MG tablet Take 1  tablet (10 mg total) by mouth daily. Patient not taking: Reported on 12/24/2022 12/17/22   Sable Feil, PA-C  testosterone cypionate (DEPOTESTOTERONE CYPIONATE) 100 MG/ML injection Inject 100 mg into the muscle every 14 (fourteen) days. For IM use only    [provider]  traZODone (DESYREL) 50 MG tablet Take 1 tablet by mouth as needed. Patient not taking: Reported on 12/17/2022 03/18/21   [provider]    Family History Family History  Problem Relation Age of Onset   Hypertension Father    Cancer Paternal Uncle    Breast cancer Maternal Grandmother    Cancer Maternal Grandfather    Cancer Paternal Grandfather     Social History Social History   Tobacco Use   Smoking status: Some Days    Types: Cigars   Smokeless tobacco: Never  Substance Use Topics   Alcohol use: Yes   Drug use: No     Allergies   Hydrocodone bit-homatrop mbr and Prednisone   Review of Systems Review of Systems Per HPI  Physical Exam Triage Vital Signs ED Triage Vitals  Enc Vitals Group     BP 12/24/22 1924 139/78     Pulse Rate 12/24/22 1924 (!) 59     Resp 12/24/22 1924 15     Temp 12/24/22 1924 98.3 F (36.8 C)     Temp  Source 12/24/22 1924 Oral     SpO2 12/24/22 1924 98 %     Weight --      Height --      Head Circumference --      Peak Flow --      Pain Score 12/24/22 1927 5     Pain Loc --      Pain Edu? --      Excl. in [place]? --    No data found.  Updated Vital Signs BP 139/78 (BP Location: Right Arm)   Pulse (!) 59   Temp 98.3 F (36.8 C) (Oral)   Resp 15   SpO2 98%   Visual Acuity Right Eye Distance:   Left Eye Distance:   Bilateral Distance:    Right Eye Near:   Left Eye Near:    Bilateral Near:     Physical Exam Vitals and nursing note reviewed.  Constitutional:      General: He is not in acute distress.    Appearance: Normal appearance.  HENT:     Head: Normocephalic.     Mouth/Throat:     Mouth: Mucous membranes are moist.  Eyes:      Extraocular Movements: Extraocular movements intact.     Pupils: Pupils are equal, round, and reactive to light.  Pulmonary:     Effort: Pulmonary effort is normal.  Musculoskeletal:     Cervical back: Signs of trauma and torticollis present. Pain with movement, spinous process tenderness and muscular tenderness present. Decreased range of motion.  Lymphadenopathy:     Cervical: No cervical adenopathy.  Skin:    General: Skin is warm and dry.  Neurological:     General: No focal deficit present.     Mental Status: He is alert and oriented to person, place, and time.  Psychiatric:        Mood and Affect: Mood normal.        Behavior: Behavior normal.      UC Treatments / Results  Labs (all labs ordered are listed, but only abnormal results are displayed) Labs Reviewed - No data to display  EKG   Radiology No results found.  Procedures Procedures (including critical care time)  Medications Ordered in UC Medications - No data to display  Initial Impression / Assessment and Plan / UC Course  I have reviewed the triage vital signs and the nursing notes.  Pertinent labs & imaging results that were available during my care of the patient were reviewed by me and considered in my medical decision making (see chart for details).  The patient is well-appearing, he is in no acute distress, vital signs are stable.  Suspect a cervical neck sprain/strain based on the mechanism of injury.  Will defer use of steroids for treatment as patient has an allergy.  Patient was provided prescription for methocarbamol 500 mg as a muscle relaxant, and ibuprofen 800 mg to continue for pain management.  Supportive care recommendations were provided to the patient to include gentle range of motion exercises, and the use of ice or heat as needed.  Patient is in agreement with this plan of care and verbalizes understanding.  All questions were answered.  Note was provided for work.   Final Clinical  Impressions(s) / UC Diagnoses   Final diagnoses:  Neck pain  Cervical sprain, initial encounter     Discharge Instructions      Take medication as prescribed. Increase fluids and allow for plenty of rest. For breakthrough pain, may  take over-the-counter Tylenol arthritis strength 650 mg tablets 1 to 2 hours after taking ibuprofen. Gentle stretching and range of motion exercises while symptoms persist. Recommend the use of ice or heat as needed.  Apply ice for pain or swelling, heat for spasm or stiffness.  Apply for 20 minutes, remove for 1 hour, then repeat is much as possible. If symptoms do not improve over the next 1 to 2 weeks, recommend following up with your primary care physician for further evaluation. Follow-up as needed.     ED Prescriptions     Medication Sig Dispense Auth. Provider   methocarbamol (ROBAXIN) 500 MG tablet Take 1 tablet (500 mg total) by mouth 2 (two) times daily. 20 tablet Dilynn Munroe-Warren, Alda Lea, NP   ibuprofen (ADVIL) 800 MG tablet Take 1 tablet (800 mg total) by mouth every 8 (eight) hours as needed. 30 tablet Kenora Spayd-Warren, Alda Lea, NP      PDMP not reviewed this encounter.   Tish Men, NP 12/25/22 [phone]

## 2022-12-24 NOTE — Discharge Instructions (Addendum)
Take medication as prescribed. Increase fluids and allow for plenty of rest. For breakthrough pain, may take over-the-counter Tylenol arthritis strength 650 mg tablets 1 to 2 hours after taking ibuprofen. Gentle stretching and range of motion exercises while symptoms persist. Recommend the use of ice or heat as needed.  Apply ice for pain or swelling, heat for spasm or stiffness.  Apply for 20 minutes, remove for 1 hour, then repeat is much as possible. If symptoms do not improve over the next 1 to 2 weeks, recommend following up with your primary care physician for further evaluation. Follow-up as needed.

## 2023-03-16 ENCOUNTER — Other Ambulatory Visit: Payer: Self-pay

## 2023-03-16 DIAGNOSIS — I10 Essential (primary) hypertension: Secondary | ICD-10-CM

## 2023-03-16 MED ORDER — AMLODIPINE BESYLATE 10 MG PO TABS: 10.0000 mg | ORAL_TABLET | Freq: Every day | ORAL | 1 refills | Status: AC

## 2023-03-20 DIAGNOSIS — E291 Testicular hypofunction: Secondary | ICD-10-CM | POA: Diagnosis not present

## 2023-03-20 DIAGNOSIS — Z79899 Other long term (current) drug therapy: Secondary | ICD-10-CM | POA: Diagnosis not present

## 2023-03-20 DIAGNOSIS — Z79890 Hormone replacement therapy (postmenopausal): Secondary | ICD-10-CM | POA: Diagnosis not present

## 2023-05-12 ENCOUNTER — Encounter: Payer: Self-pay | Admitting: Physician Assistant

## 2023-05-12 ENCOUNTER — Ambulatory Visit: Payer: Self-pay | Admitting: Physician Assistant

## 2023-05-12 VITALS — BP 144/96 | HR 77 | Temp 99.3°F | Resp 14 | Wt 210.0 lb

## 2023-05-12 DIAGNOSIS — S80211A Abrasion, right knee, initial encounter: Secondary | ICD-10-CM

## 2023-05-12 NOTE — Progress Notes (Signed)
Documentation of incident with suspect during work as [employer] and note abrasions on bilateral knees and left index finger dorsal side.  Stated these are ok, but wanted to document and had bandaids on both with small amount serous drainage and no sign of infection.
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
